# Patient Record
Sex: Male | Born: 1990 | Race: Black or African American | Hispanic: No | Marital: Single | Smoking: Current every day smoker
Health system: Southern US, Community
[De-identification: ages and names within clinical notes are randomized; demographics above are authoritative.]

## PROBLEM LIST (undated history)

## (undated) HISTORY — PX: FINGER SURGERY: SHX640

---
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
## 2013-07-03 ENCOUNTER — Emergency Department (HOSPITAL_COMMUNITY): Payer: Medicaid Other

## 2013-07-03 ENCOUNTER — Encounter (HOSPITAL_COMMUNITY): Payer: Self-pay | Admitting: Emergency Medicine

## 2013-07-03 DIAGNOSIS — S82899A Other fracture of unspecified lower leg, initial encounter for closed fracture: Secondary | ICD-10-CM | POA: Insufficient documentation

## 2013-07-03 DIAGNOSIS — Y929 Unspecified place or not applicable: Secondary | ICD-10-CM | POA: Insufficient documentation

## 2013-07-03 DIAGNOSIS — Y9389 Activity, other specified: Secondary | ICD-10-CM | POA: Insufficient documentation

## 2013-07-03 DIAGNOSIS — X500XXA Overexertion from strenuous movement or load, initial encounter: Secondary | ICD-10-CM | POA: Insufficient documentation

## 2013-07-03 DIAGNOSIS — W010XXA Fall on same level from slipping, tripping and stumbling without subsequent striking against object, initial encounter: Secondary | ICD-10-CM | POA: Insufficient documentation

## 2013-07-03 NOTE — ED Notes (Signed)
PT. MISSED HIS STEP WHILE GOING DOWN STAIRS AND TWISTED HIS RIGHT ANKLE THIS EVENING , PRESENTS WITH PAIN / SLIGHT SWELLING AT RIGHT ANKLE .

## 2013-07-04 ENCOUNTER — Emergency Department (HOSPITAL_COMMUNITY)
Admission: EM | Admit: 2013-07-04 | Discharge: 2013-07-04 | Disposition: A | Discharge status: Home / Self Care | Payer: Medicaid Other | Attending: Emergency Medicine | Admitting: Emergency Medicine

## 2013-07-04 ENCOUNTER — Emergency Department (HOSPITAL_COMMUNITY): Payer: Medicaid Other

## 2013-07-04 DIAGNOSIS — S82401A Unspecified fracture of shaft of right fibula, initial encounter for closed fracture: Secondary | ICD-10-CM

## 2013-07-04 MED ORDER — HYDROCODONE-ACETAMINOPHEN 5-325 MG PO TABS: 1.0000 | ORAL_TABLET | ORAL | Status: DC | PRN

## 2013-07-04 MED ORDER — ONDANSETRON 4 MG PO TBDP
8.0000 mg | ORAL_TABLET | Freq: Once | ORAL | Status: AC
  Administered 2013-07-04: 8 mg via ORAL
  Filled 2013-07-04: qty 2

## 2013-07-04 MED ORDER — OXYCODONE-ACETAMINOPHEN 5-325 MG PO TABS
2.0000 | ORAL_TABLET | Freq: Once | ORAL | Status: AC
  Administered 2013-07-04: 2 via ORAL
  Filled 2013-07-04: qty 2

## 2013-07-04 NOTE — Progress Notes (Signed)
Orthopedic Tech Progress Note Patient Details:  Isaac Blanchard 1991-04-06 [id number]  Ortho Devices Type of Ortho Device: CAM walker   Haskell Flirt 07/04/2013, 3:46 AM

## 2013-07-04 NOTE — ED Provider Notes (Signed)
CSN: [id number]     Arrival date & time 07/03/13  2325 History     None    Chief Complaint  Patient presents with  . Ankle Pain   (Consider location/radiation/quality/duration/timing/severity/associated sxs/prior Treatment) HPI History provided by pt.   Pt was jogging down the stairs at 10pm yesterday when he slipped and fell and twisted his right ankle.  Did not hit head and denies neck/back pain.  C/o severe pain right lateral ankle and foot w/ inability to bear weight.  No associated numbness.  Has not taken anything for pain. History reviewed. No pertinent past medical history. History reviewed. No pertinent past surgical history. No family history on file. History  Substance Use Topics  . Smoking status: Not on file  . Smokeless tobacco: Not on file  . Alcohol Use: Not on file    Review of Systems  All other systems reviewed and are negative.    Allergies  Review of patient's allergies indicates no known allergies.  Home Medications   Current Outpatient Rx  Name  Route  Sig  Dispense  Refill  . HYDROcodone-acetaminophen (NORCO/VICODIN) 5-325 MG per tablet   Oral   Take 1 tablet by mouth every 4 (four) hours as needed for pain.   20 tablet   0    BP 105/56  Pulse 71  Temp(Src) 98.4 F (36.9 C) (Oral)  Resp 16  SpO2 98% Physical Exam  Nursing note and vitals reviewed. Constitutional: He is oriented to person, place, and time. He appears well-developed and well-nourished. No distress.  HENT:  Head: Normocephalic and atraumatic.  Eyes:  Normal appearance  Neck: Normal range of motion.  Cardiovascular: Normal rate.   Pulmonary/Chest: Effort normal. No respiratory distress.  Musculoskeletal: Normal range of motion.  No deformity of right ankle/foot.  Right lateral malleolus and forefoot edematous and severely ttp.  No ecchymosis.  Full, active ROM of toes.  Pain w/ passive ROM of ankle.   2+ DP pulse and distal sensation intact.    Neurological: He is alert  and oriented to person, place, and time.  Skin: Skin is warm and dry. No rash noted.  Psychiatric: He has a normal mood and affect. His behavior is normal.    ED Course   Procedures (including critical care time)  Labs Reviewed - No data to display Dg Ankle Complete Right  07/04/2013   *RADIOLOGY REPORT*  Clinical Data: Right ankle pain after twisting injury.  RIGHT ANKLE - COMPLETE 3+ VIEW  Comparison: None.  Findings: There is some soft tissue swelling about the lateral malleolus with a tiny avulsion fracture off the tip of the distal fibula.  No other acute bony or joint abnormality is identified.  IMPRESSION: Tiny avulsion fracture tip of the distal right fibula with associated soft tissue swelling.   Original Report Authenticated By: Holley Dexter, M.D.   Dg Foot Complete Right  07/04/2013   *RADIOLOGY REPORT*  Clinical Data: Fall.  Right foot pain.  RIGHT FOOT COMPLETE - 3+ VIEW  Comparison: None.  Findings: Soft tissues of the foot appear swollen.  No fracture is identified.  IMPRESSION: Soft tissue swelling without underlying fracture.   Original Report Authenticated By: Holley Dexter, M.D.   1. Fracture of right fibula, closed, initial encounter     MDM  22yo M presents w/ right ankle pain/edema s/p mechanical fall yesterday evening.  Xray shows distal fibula avulsion fx.  NV intact on exam.  Pt received percocet for pain in ED, ortho tech  provided w/ cam walker, and d/c'd home w/ vicodin and referral to ortho.  Recommended ice and elevation at home.    Otilio Miu, PA-C 07/04/13 [phone]

## 2013-07-05 NOTE — ED Provider Notes (Signed)
Medical screening examination/treatment/procedure(s) were performed by non-physician practitioner and as supervising physician I was immediately available for consultation/collaboration.  Omri Bertran, MD 07/05/13 0010 

## 2015-05-08 ENCOUNTER — Emergency Department (HOSPITAL_COMMUNITY)
Admission: EM | Admit: 2015-05-08 | Discharge: 2015-05-08 | Disposition: A | Discharge status: Home / Self Care | Payer: Self-pay | Attending: Emergency Medicine | Admitting: Emergency Medicine

## 2015-05-08 ENCOUNTER — Encounter (HOSPITAL_COMMUNITY): Payer: Self-pay | Admitting: Emergency Medicine

## 2015-05-08 ENCOUNTER — Emergency Department (HOSPITAL_COMMUNITY): Payer: Medicaid Other

## 2015-05-08 DIAGNOSIS — S8991XA Unspecified injury of right lower leg, initial encounter: Secondary | ICD-10-CM | POA: Insufficient documentation

## 2015-05-08 DIAGNOSIS — M25561 Pain in right knee: Secondary | ICD-10-CM

## 2015-05-08 DIAGNOSIS — Y9341 Activity, dancing: Secondary | ICD-10-CM | POA: Insufficient documentation

## 2015-05-08 DIAGNOSIS — X58XXXA Exposure to other specified factors, initial encounter: Secondary | ICD-10-CM | POA: Insufficient documentation

## 2015-05-08 DIAGNOSIS — Z720 Tobacco use: Secondary | ICD-10-CM | POA: Insufficient documentation

## 2015-05-08 DIAGNOSIS — Y998 Other external cause status: Secondary | ICD-10-CM | POA: Insufficient documentation

## 2015-05-08 DIAGNOSIS — Y9289 Other specified places as the place of occurrence of the external cause: Secondary | ICD-10-CM | POA: Insufficient documentation

## 2015-05-08 MED ORDER — IBUPROFEN 800 MG PO TABS: 800.0000 mg | ORAL_TABLET | Freq: Three times a day (TID) | ORAL | Status: DC

## 2015-05-08 MED ORDER — KETOROLAC TROMETHAMINE 60 MG/2ML IM SOLN
60.0000 mg | Freq: Once | INTRAMUSCULAR | Status: AC
  Administered 2015-05-08: 60 mg via INTRAMUSCULAR
  Filled 2015-05-08: qty 2

## 2015-05-08 NOTE — ED Notes (Signed)
Pt reports while dancing last night he caused injury to his right knee.

## 2015-05-08 NOTE — ED Provider Notes (Signed)
CSN: [id number]     Arrival date & time 05/08/15  1331 History   First MD Initiated Contact with Patient 05/08/15 1433     Chief Complaint  Patient presents with  . Knee Pain     (Consider location/radiation/quality/duration/timing/severity/associated sxs/prior Treatment) HPI   Mr. Mccarthy is a 24 year old male otherwise healthy, who presents to the emergency department this morning with acute onset right knee pain which occurred last night as he was out dancing with his girlfriend, when his knee gave out on him because of reported patella dislocation and spontaneous reduction. He observed the deformity when he was on the ground, which he describes as "a bulge on the side", and was able to get back up and bear weight. He states the pain is worse today, rated 6/10, and is worse with weightbearing and movement.  He has not taken any medicine for pain, but has applied ice one time, without out much relief.    History reviewed. No pertinent past medical history. Past Surgical History  Procedure Laterality Date  . Finger surgery Right    No family history on file. History  Substance Use Topics  . Smoking status: Current Every Day Smoker  . Smokeless tobacco: Not on file  . Alcohol Use: Yes    Review of Systems  All other systems reviewed and are negative.   Allergies  Review of patient's allergies indicates no known allergies.  Home Medications   Prior to Admission medications   Medication Sig Start Date End Date Taking? Authorizing Provider  HYDROcodone-acetaminophen (NORCO/VICODIN) 5-325 MG per tablet Take 1 tablet by mouth every 4 (four) hours as needed for pain. 07/04/13   Ruby Cola, PA-C  ibuprofen (ADVIL,MOTRIN) 800 MG tablet Take 1 tablet (800 mg total) by mouth 3 (three) times daily. 05/08/15   Danelle Berry, PA-C   BP 117/65 mmHg  Pulse 70  Temp(Src) 99 F (37.2 C) (Oral)  Resp 18  Ht  (1.854 m)  Wt 175 lb (79.379 kg)  BMI 23.09 kg/m2  SpO2  100% Physical Exam   PE: Constitutional: well-developed, well-nourished, no apparent distress HENT: normocephalic, atraumatic Cardiovascular: normal rate and rhythm, LE bilateral distal pulses intact Musculoskeletal: Right knee:  No edema, erythema, ecchymosis, deformity, mildly ttp along medial joint line/mcl, not ttp patella, patella tendon, tibial tuberosity, popliteal fossa.  No ligamentous laxity, negative Lachman's, normal flexion to 90 degrees and exstention.  Skin: warm and dry, no rash, no diaphoresis Psychiatric: normal mood and affect, normal behavior  Neuro:  Sensation intact, antalgic gait, favoring left side      ED Course  Procedures (including critical care time) Labs Review Labs Reviewed - No data to display  Imaging Review Dg Knee Complete 4 Views Right  05/08/2015   CLINICAL DATA:  Acute twisting injury last night with right knee pain and swelling. Initial encounter.  EXAM: RIGHT KNEE - COMPLETE 4+ VIEW  COMPARISON:  None.  FINDINGS: There is no evidence of fracture, dislocation, or joint effusion. There is no evidence of arthropathy or other focal bone abnormality. Soft tissues are unremarkable.  IMPRESSION: Negative.   Electronically Signed   By: Harmon Pier M.D.   On: 05/08/2015 15:52     EKG Interpretation None      MDM   Final diagnoses:  Knee pain, acute, right    Pt present with right knee pain after reported patellar dislocation and spontaneous reduction, occurred last night, with subsequent pain and swelling, which is worse today.  Exam reveals  no joint instability, no impressive effusion, patient can bear weight, with slightly antalgic gait, normal range of motion. There is some mild tenderness to the medial aspect of the knee. No tenderness to palpation on or around the patella.  Plan is to get x-rays of the knee to rule out any fracture or bone fragment.  Patient declining Toradol IM, will treat with 800 mg ibuprofen and apply ice to the knee, and  a sleeve.  Waiting for x-ray results at this time  Danelle Berry, PA-C 5:05 PM   X-rays negative, patient was clear to discharge home and follow-up with PCP or ortho outpt.       Danelle Berry, PA-C 05/08/15 1707  Blake Divine, MD 05/10/15 2037

## 2015-05-08 NOTE — Discharge Instructions (Signed)
Apply a compressive ACE bandage. Rest and elevate the affected painful area.  Apply cold compresses intermittently as needed.  As pain recedes, begin normal activities slowly as tolerated.  Call if symptoms persist.   Knee Bracing Knee braces are supports to help stabilize and protect an injured or painful knee. They come in many different styles. They should support and protect the knee without increasing the chance of other injuries to yourself or others. It is important not to have a false sense of security when using a brace. Knee braces that help you to keep using your knee:  Do not restore normal knee stability under high stress forces.  May decrease some aspects of athletic performance. Some of the different types of knee braces are:  Prophylactic knee braces are designed to prevent or reduce the severity of knee injuries during sports that make injury to the knee more likely.  Rehabilitative knee braces are designed to allow protected motion of:  Injured knees.  Knees that have been treated with or without surgery. There is no evidence that the use of a supportive knee brace protects the graft following a successful anterior cruciate ligament (ACL) reconstruction. However, braces are sometimes used to:   Protect injured ligaments.  Control knee movement during the initial healing period. They may be used as part of the treatment program for the various injured ligaments or cartilage of the knee including the:  Anterior cruciate ligament.  Medial collateral ligament.  Medial or lateral cartilage (meniscus).  Posterior cruciate ligament.  Lateral collateral ligament. Rehabilitative knee braces are most commonly used:  During crutch-assisted walking right after injury.  During crutch-assisted walking right after surgery to repair the cartilage and/or cruciate ligament injury.  For a short period of time, 2-8 weeks, after the injury or surgery. The value of a  rehabilitative brace as opposed to a cast or splint includes the:  Ability to adjust the brace for swelling.  Ability to remove the brace for examinations, icing, or showering.  Ability to allow for movement in a controlled range of motion. Functional knee braces give support to knees that have already been injured. They are designed to provide stability for the injured knee and provide protection after repair. Functional knee braces may not affect performance much. Lower extremity muscle strengthening, flexibility, and improvement in technique are more important than bracing in treating ligamentous knee injuries. Functional braces are not a substitute for rehabilitation or surgical procedures. Unloader/off-loader braces are designed to provide pain relief in arthritic knees. Patients with wear and tear arthritis from growing old or from an old cartilage injury (osteoarthritis) of the knee, and bowlegged (varus) or knock-knee (valgus) deformities, often develop increased pain in the arthritic side due to increased loading. Unloader/off-loader braces are made to reduce uneven loading in such knees. There is reduction in bowing out movement in bowlegged knees when the correct unloader brace is used. Patients with advanced osteoarthritis or severe varus or valgus alignment problems would not likely benefit from bracing. Patellofemoral braces help the kneecap to move smoothly and well centered over the end of the femur in the knee.  Most people who wear knee braces feel that they help. However, there is a lack of scientific evidence that knee braces are helpful at the level needed for athletic participation to prevent injury. In spite of this, athletes report an increase in knee stability, pain relief, performance improvement, and confidence during athletics when using a brace.  Different knee problems require different knee braces:  Your caregiver may suggest one kind of knee brace after knee surgery.  A  caregiver may choose another kind of knee brace for support instead of surgery for some types of torn ligaments.  You may also need one for pain in the front of your knee that is not getting better with strengthening and flexibility exercises. Get your caregiver's advice if you want to try a knee brace. The caregiver will advise you on where to get them and provide a prescription when it is needed to fashion and/or fit the brace. Knee braces are the least important part of preventing knee injuries or getting better following injury. Stretching, strengthening and technique improvement are far more important in caring for and preventing knee injuries. When strengthening your knee, increase your activities a little at a time so as not to develop injuries from overuse. Work out an exercise plan with your caregiver and/or physical therapist to get the best program for you. Do not let a knee brace become a crutch. Always remember, there are no braces which support the knee as well as your original ligaments and cartilage you were born with. Conditioning, proper warm-up, and stretching remain the most important parts of keeping your knees healthy. HOW TO USE A KNEE BRACE  During sports, knee braces should be used as directed by your caregiver.  Make sure that the hinges are where the knee bends.  Straps, tapes, or hook-and-loop tapes should be fastened around your leg as instructed.  You should check the placement of the brace during activities to make sure that it has not moved. Poorly positioned braces can hurt rather than help you.  To work well, a knee brace should be worn during all activities that put you at risk of knee injury.  Warm up properly before beginning athletic activities. HOME CARE INSTRUCTIONS  Knee braces often get damaged during normal use. Replace worn-out braces for maximum benefit.  Clean regularly with soap and water.  Inspect your brace often for wear and tear.  Cover  exposed metal to protect others from injury.  Durable materials may cost more, but last longer. SEEK IMMEDIATE MEDICAL CARE IF:   Your knee seems to be getting worse rather than better.  You have increasing pain or swelling in the knee.  You have problems caused by the knee brace.  You have increased swelling or inflammation (redness or soreness) in your knee.  Your knee becomes warm and more painful and you develop an unexplained temperature over 101F (38.3C). MAKE SURE YOU:   Understand these instructions.  Will watch your condition.  Will get help right away if you are not doing well or get worse. See your caregiver, physical therapist, or orthopedic surgeon for additional information. Document Released: 02/03/2004 Document Revised: 03/30/2014 Document Reviewed: 05/12/2009 [facility] Patient Information 2015 [place], [place]. This information is not intended to replace advice given to you by your health care provider. Make sure you discuss any questions you have with your health care provider.

## 2016-12-05 ENCOUNTER — Encounter (HOSPITAL_COMMUNITY): Payer: Self-pay | Admitting: Family Medicine

## 2016-12-05 ENCOUNTER — Ambulatory Visit (HOSPITAL_COMMUNITY)
Admission: EM | Admit: 2016-12-05 | Discharge: 2016-12-05 | Disposition: A | Discharge status: Home / Self Care | Payer: Medicaid Other | Attending: Family Medicine | Admitting: Family Medicine

## 2016-12-05 DIAGNOSIS — F17200 Nicotine dependence, unspecified, uncomplicated: Secondary | ICD-10-CM | POA: Insufficient documentation

## 2016-12-05 DIAGNOSIS — N342 Other urethritis: Secondary | ICD-10-CM | POA: Insufficient documentation

## 2016-12-05 DIAGNOSIS — R21 Rash and other nonspecific skin eruption: Secondary | ICD-10-CM | POA: Insufficient documentation

## 2016-12-05 DIAGNOSIS — N341 Nonspecific urethritis: Secondary | ICD-10-CM

## 2016-12-05 MED ORDER — DOXYCYCLINE HYCLATE 100 MG PO CAPS: 100.0000 mg | ORAL_CAPSULE | Freq: Two times a day (BID) | ORAL | 0 refills | Status: DC

## 2016-12-05 MED ORDER — AZITHROMYCIN 250 MG PO TABS
ORAL_TABLET | ORAL | Status: AC
  Filled 2016-12-05: qty 4

## 2016-12-05 MED ORDER — CEFTRIAXONE SODIUM 250 MG IJ SOLR
INTRAMUSCULAR | Status: AC
  Filled 2016-12-05: qty 250

## 2016-12-05 MED ORDER — AZITHROMYCIN 250 MG PO TABS
1000.0000 mg | ORAL_TABLET | Freq: Once | ORAL | Status: AC
  Administered 2016-12-05: 1000 mg via ORAL

## 2016-12-05 MED ORDER — STERILE WATER FOR INJECTION IJ SOLN
INTRAMUSCULAR | Status: AC
  Filled 2016-12-05: qty 10

## 2016-12-05 MED ORDER — CEFTRIAXONE SODIUM 250 MG IJ SOLR
250.0000 mg | Freq: Once | INTRAMUSCULAR | Status: AC
  Administered 2016-12-05: 250 mg via INTRAMUSCULAR

## 2016-12-05 NOTE — Discharge Instructions (Signed)
We will call with positive test results and treat as indicated , take all of antibiotic.

## 2016-12-05 NOTE — ED Triage Notes (Signed)
Pt here for penile discharge and bumps to penis x a couple of days.

## 2016-12-05 NOTE — ED Provider Notes (Signed)
[facility]    CSN: [id number] Arrival date & time: 12/05/16  1546     History   Chief Complaint Chief Complaint  Patient presents with  . Penile Discharge  . Rash    HPI Isaac Blanchard is a [age] y.o. male.   The history is provided by the patient.  Penile Discharge  This is a new problem. The current episode started 2 days ago. The problem has not changed since onset.Associated symptoms comments: No dysuria..  Rash    History reviewed. No pertinent past medical history.  There are no active problems to display for this patient.   Past Surgical History:  Procedure Laterality Date  . FINGER SURGERY Right        Home Medications    Prior to Admission medications   Medication Sig Start Date End Date Taking? Authorizing Provider  doxycycline (VIBRAMYCIN) 100 MG capsule Take 1 capsule (100 mg total) by mouth 2 (two) times daily. 12/05/16   Linna HoffJames D Ulanda Tackett, MD  HYDROcodone-acetaminophen (NORCO/VICODIN) 5-325 MG per tablet Take 1 tablet by mouth every 4 (four) hours as needed for pain. 07/04/13   Ruby Colaatherine Schinlever, PA-C  ibuprofen (ADVIL,MOTRIN) 800 MG tablet Take 1 tablet (800 mg total) by mouth 3 (three) times daily. 05/08/15   Danelle BerryLeisa Tapia, PA-C    Family History History reviewed. No pertinent family history.  Social History Social History  Substance Use Topics  . Smoking status: Current Every Day Smoker  . Smokeless tobacco: Never Used  . Alcohol use Yes     Allergies   Patient has no known allergies.   Review of Systems Review of Systems  Gastrointestinal: Negative.   Genitourinary: Positive for discharge and genital sores. Negative for dysuria, flank pain, penile pain, scrotal swelling and testicular pain.  Skin: Positive for rash.  All other systems reviewed and are negative.    Physical Exam Triage Vital Signs ED Triage Vitals [12/05/16 1650]  Enc Vitals Group     BP 136/72     Pulse Rate 108     Resp 18     Temp 98.7 F (37.1 C)       Temp src      SpO2 100 %     Weight      Height      Head Circumference      Peak Flow      Pain Score      Pain Loc      Pain Edu?      Excl. in GC?    No data found.   Updated Vital Signs BP 136/72   Pulse 108   Temp 98.7 F (37.1 C)   Resp 18   SpO2 100%   Visual Acuity Right Eye Distance:   Left Eye Distance:   Bilateral Distance:    Right Eye Near:   Left Eye Near:    Bilateral Near:     Physical Exam  Constitutional: He is oriented to person, place, and time. He appears well-developed and well-nourished. No distress.  Abdominal: Soft. Bowel sounds are normal.  Genitourinary:  Genitourinary Comments: Penis with 2 adjacent shaft abrasions, no drainage, does have sl whitish urethral d/c  Musculoskeletal: Normal range of motion.  Neurological: He is alert and oriented to person, place, and time.  Nursing note and vitals reviewed.    UC Treatments / Results  Labs (all labs ordered are listed, but only abnormal results are displayed) Labs Reviewed  CYTOLOGY, (ORAL, ANAL, URETHRAL) ANCILLARY  ONLY    EKG  EKG Interpretation None       Radiology No results found.  Procedures Procedures (including critical care time)  Medications Ordered in UC Medications  azithromycin (ZITHROMAX) tablet 1,000 mg (1,000 mg Oral Given 12/05/16 1758)  cefTRIAXone (ROCEPHIN) injection 250 mg (250 mg Intramuscular Given 12/05/16 1759)     Initial Impression / Assessment and Plan / UC Course  I have reviewed the triage vital signs and the nursing notes.  Pertinent labs & imaging results that were available during my care of the patient were reviewed by me and considered in my medical decision making (see chart for details).       Final Clinical Impressions(s) / UC Diagnoses   Final diagnoses:  Urethritis, nonspecific    New Prescriptions Discharge Medication List as of 12/05/2016  5:52 PM    START taking these medications   Details  doxycycline  (VIBRAMYCIN) 100 MG capsule Take 1 capsule (100 mg total) by mouth 2 (two) times daily., Starting Tue 12/05/2016, Print         Linna Hoff, MD 12/24/16 1311

## 2016-12-06 LAB — CYTOLOGY, (ORAL, ANAL, URETHRAL) ANCILLARY ONLY
CHLAMYDIA, DNA PROBE: NEGATIVE
Neisseria Gonorrhea: NEGATIVE

## 2018-04-27 ENCOUNTER — Encounter (HOSPITAL_COMMUNITY): Payer: Self-pay

## 2018-04-27 ENCOUNTER — Emergency Department (HOSPITAL_COMMUNITY)
Admission: EM | Admit: 2018-04-27 | Discharge: 2018-04-28 | Disposition: A | Discharge status: Home / Self Care | Payer: Medicaid Other | Attending: Emergency Medicine | Admitting: Emergency Medicine

## 2018-04-27 DIAGNOSIS — Y939 Activity, unspecified: Secondary | ICD-10-CM | POA: Insufficient documentation

## 2018-04-27 DIAGNOSIS — Z79899 Other long term (current) drug therapy: Secondary | ICD-10-CM | POA: Insufficient documentation

## 2018-04-27 DIAGNOSIS — S91331A Puncture wound without foreign body, right foot, initial encounter: Secondary | ICD-10-CM | POA: Insufficient documentation

## 2018-04-27 DIAGNOSIS — Y929 Unspecified place or not applicable: Secondary | ICD-10-CM | POA: Insufficient documentation

## 2018-04-27 DIAGNOSIS — Y999 Unspecified external cause status: Secondary | ICD-10-CM | POA: Insufficient documentation

## 2018-04-27 DIAGNOSIS — F17200 Nicotine dependence, unspecified, uncomplicated: Secondary | ICD-10-CM | POA: Insufficient documentation

## 2018-04-27 DIAGNOSIS — W3400XA Accidental discharge from unspecified firearms or gun, initial encounter: Secondary | ICD-10-CM | POA: Insufficient documentation
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
## 2018-04-28 ENCOUNTER — Emergency Department (HOSPITAL_COMMUNITY): Payer: Medicaid Other

## 2018-04-28 MED ORDER — TRAMADOL HCL 50 MG PO TABS: 50.0000 mg | ORAL_TABLET | Freq: Four times a day (QID) | ORAL | 0 refills | Status: DC | PRN

## 2018-04-28 NOTE — Progress Notes (Signed)
Orthopedic Tech Progress Note Patient Details:  Isaac Blanchard 04-Aug-1991 [id number]  Ortho Devices Type of Ortho Device: Crutches Ortho Device/Splint Interventions: Ordered   Post Interventions Patient Tolerated: Well Instructions Provided: Care of device, Adjustment of device   [name], Naksh Radi J 04/28/2018, 2:11 AM

## 2018-04-28 NOTE — ED Notes (Signed)
Ortho tech paged  
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
## 2018-04-28 NOTE — Discharge Instructions (Addendum)
Apply ice, keep your foot elevated as much as possible.   Use crutches until you are able to put weight on your foot. After that, activity as tolerated.  Take ibuprofen or acetaminophen as needed for less severe pain.

## 2018-05-09 ENCOUNTER — Ambulatory Visit (INDEPENDENT_AMBULATORY_CARE_PROVIDER_SITE_OTHER): Payer: Self-pay | Admitting: Orthopedic Surgery

## 2020-02-09 ENCOUNTER — Emergency Department (HOSPITAL_COMMUNITY): Payer: No Typology Code available for payment source

## 2020-02-09 ENCOUNTER — Emergency Department (HOSPITAL_COMMUNITY)
Admission: EM | Admit: 2020-02-09 | Discharge: 2020-02-09 | Disposition: A | Discharge status: Home / Self Care | Payer: No Typology Code available for payment source | Attending: Emergency Medicine | Admitting: Emergency Medicine

## 2020-02-09 ENCOUNTER — Other Ambulatory Visit: Payer: Self-pay

## 2020-02-09 ENCOUNTER — Encounter (HOSPITAL_COMMUNITY): Payer: Self-pay

## 2020-02-09 DIAGNOSIS — M79662 Pain in left lower leg: Secondary | ICD-10-CM | POA: Diagnosis not present

## 2020-02-09 DIAGNOSIS — F17210 Nicotine dependence, cigarettes, uncomplicated: Secondary | ICD-10-CM | POA: Insufficient documentation

## 2020-02-09 DIAGNOSIS — R42 Dizziness and giddiness: Secondary | ICD-10-CM | POA: Diagnosis not present

## 2020-02-09 MED ORDER — IBUPROFEN 400 MG PO TABS
600.0000 mg | ORAL_TABLET | Freq: Once | ORAL | Status: AC
  Administered 2020-02-09: 600 mg via ORAL
  Filled 2020-02-09: qty 1

## 2020-02-09 MED ORDER — METHOCARBAMOL 500 MG PO TABS: 500.0000 mg | ORAL_TABLET | Freq: Two times a day (BID) | ORAL | 0 refills | Status: AC

## 2020-02-09 NOTE — Discharge Instructions (Signed)

## 2020-02-09 NOTE — ED Notes (Signed)
Pt was discharged from the ED. Pt read and understood discharge paperwork. Pt had vital signs completed. Pt conscious, breathing, and A&Ox4. No distress noted. Pt speaking in complete sentences. Pt ambulated out of the ED with a smooth and steady gait. Pt in custody of [employer] upon discharge. E-signature not available.
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
## 2020-02-09 NOTE — ED Notes (Signed)
Blood drawn for the [employer] with patient consent. Box opened in front of patient. Directions followed from inside the kit and blood obtained for [employer] by this RN. No distress noted.
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
